# Patient Record
Sex: Male | Born: 1995 | Race: White | Hispanic: No | Marital: Single | Smoking: Never smoker
Health system: Southern US, Community
[De-identification: ages and names within clinical notes are randomized; demographics above are authoritative.]

## PROBLEM LIST (undated history)

## (undated) DIAGNOSIS — K219 Gastro-esophageal reflux disease without esophagitis: Secondary | ICD-10-CM

## (undated) HISTORY — PX: WISDOM TOOTH EXTRACTION: SHX21

---
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
## 2015-05-31 ENCOUNTER — Encounter (INDEPENDENT_AMBULATORY_CARE_PROVIDER_SITE_OTHER): Payer: Self-pay

## 2015-05-31 ENCOUNTER — Encounter: Payer: Self-pay | Admitting: Family Medicine

## 2015-05-31 ENCOUNTER — Ambulatory Visit (INDEPENDENT_AMBULATORY_CARE_PROVIDER_SITE_OTHER): Payer: Medicaid Other | Admitting: Family Medicine

## 2015-05-31 VITALS — BP 117/72 | HR 90 | Temp 98.2°F | Ht 68.19 in | Wt 116.6 lb

## 2015-05-31 DIAGNOSIS — W57XXXA Bitten or stung by nonvenomous insect and other nonvenomous arthropods, initial encounter: Secondary | ICD-10-CM | POA: Diagnosis not present

## 2015-05-31 DIAGNOSIS — J029 Acute pharyngitis, unspecified: Secondary | ICD-10-CM

## 2015-05-31 LAB — VERITOR FLU A/B WAIVED
INFLUENZA A: NEGATIVE
Influenza B: NEGATIVE

## 2015-05-31 LAB — RAPID STREP SCREEN (MED CTR MEBANE ONLY): Strep Gp A Ag, IA W/Reflex: POSITIVE — AB

## 2015-05-31 MED ORDER — DOXYCYCLINE HYCLATE 100 MG PO TABS: 100.0000 mg | ORAL_TABLET | Freq: Two times a day (BID) | ORAL | Status: DC

## 2015-05-31 NOTE — Addendum Note (Signed)
Addended by: Arville CareETTINGER, JOSHUA on: 05/31/2015 01:01 PM   Modules accepted: Orders, SmartSet

## 2015-05-31 NOTE — Progress Notes (Signed)
BP 117/72 mmHg  Pulse 90  Temp(Src) 98.2 F (36.8 C) (Oral)  Ht 5' 8.19" (1.732 m)  Wt 116 lb 9.6 oz (52.889 kg)  BMI 17.63 kg/m2   Subjective:    Patient ID: Brandon Glenn, male    DOB: 09-Apr-1995, 20 y.o.   MRN: [id number]  HPI: Brandon Glenn is a 20 y.o. male presenting on 05/31/2015 for Sore Throat and Vomiting   HPI Sore throat Patient has been having sore throat and congestion and a couple episodes of vomiting and abdominal discomfort. This all started yesterday. He denies any known fevers but he has had chills and aches. He was also clammy last night overnight. He denies any shortness of breath or wheezing. His sore throat is made it so easily not drinking or eating much because of the soreness.  Tick bite. Patient noticed that he had a tick bite and pulled the tick out of his left hamstring 2 days ago. He does not want how long the tick was in him but it did not look fully engorged at the time. He denies any fevers or chills or circular rashes anywhere else on his body. There is a small amount of bruising around with a tick bite was.  Relevant past medical, surgical, family and social history reviewed and updated as indicated. Interim medical history since our last visit reviewed. Allergies and medications reviewed and updated.  Review of Systems  Constitutional: Positive for chills. Negative for fever.  HENT: Positive for congestion, postnasal drip, rhinorrhea, sinus pressure, sneezing and sore throat. Negative for ear discharge, ear pain and voice change.   Eyes: Negative for pain, discharge, redness and visual disturbance.  Respiratory: Positive for cough. Negative for chest tightness, shortness of breath and wheezing.   Cardiovascular: Negative for chest pain and leg swelling.  Gastrointestinal: Negative for abdominal pain, diarrhea and constipation.  Genitourinary: Negative for difficulty urinating.  Musculoskeletal: Positive for myalgias. Negative for back  pain and gait problem.  Skin: Positive for wound. Negative for rash.  Neurological: Negative for syncope, light-headedness and headaches.  All other systems reviewed and are negative.   Per HPI unless specifically indicated above     Medication List       This list is accurate as of: 05/31/15 12:56 PM.  Always use your most recent med list.               doxycycline 100 MG tablet  Commonly known as:  VIBRA-TABS  Take 1 tablet (100 mg total) by mouth 2 (two) times daily. 1 po bid           Objective:    BP 117/72 mmHg  Pulse 90  Temp(Src) 98.2 F (36.8 C) (Oral)  Ht 5' 8.19" (1.732 m)  Wt 116 lb 9.6 oz (52.889 kg)  BMI 17.63 kg/m2  Wt Readings from Last 3 Encounters:  05/31/15 116 lb 9.6 oz (52.889 kg) (2 %*, Z = -2.02)  08/15/14 118 lb (53.524 kg) (4 %*, Z = -1.79)  01/25/14 116 lb 6.4 oz (52.799 kg) (4 %*, Z = -1.77)   * Growth percentiles are based on CDC 2-20 Years data.    Physical Exam  Constitutional: He is oriented to person, place, and time. He appears well-developed and well-nourished. No distress.  HENT:  Right Ear: Tympanic membrane, external ear and ear canal normal.  Left Ear: Tympanic membrane, external ear and ear canal normal.  Nose: Mucosal edema and rhinorrhea present. No sinus tenderness. No epistaxis.  Right sinus exhibits maxillary sinus tenderness. Right sinus exhibits no frontal sinus tenderness. Left sinus exhibits maxillary sinus tenderness. Left sinus exhibits no frontal sinus tenderness.  Mouth/Throat: Uvula is midline and mucous membranes are normal. Posterior oropharyngeal edema and posterior oropharyngeal erythema present. No oropharyngeal exudate or tonsillar abscesses.  Eyes: Conjunctivae and EOM are normal. Pupils are equal, round, and reactive to light. Right eye exhibits no discharge. No scleral icterus.  Neck: Neck supple. No thyromegaly present.  Cardiovascular: Normal rate, regular rhythm, normal heart sounds and intact distal  pulses.   No murmur heard. Pulmonary/Chest: Effort normal and breath sounds normal. No respiratory distress. He has no wheezes. He has no rales.  Musculoskeletal: Normal range of motion. He exhibits no edema.  Lymphadenopathy:    He has no cervical adenopathy.  Neurological: He is alert and oriented to person, place, and time. Coordination normal.  Skin: Skin is warm and dry. Lesion (Small area of bruising where the bite from the tick was.) noted. No rash noted. He is not diaphoretic.  Psychiatric: He has a normal mood and affect. His behavior is normal.  Nursing note and vitals reviewed.   Flu negative, strep positive    Assessment & Plan:   Problem List Items Addressed This Visit    None    Visit Diagnoses    Tick bite    -  Primary    Left hamstring tick bite, does not know how long it was in his skin. We'll give doxycycline    Relevant Medications    doxycycline (VIBRA-TABS) 100 MG tablet    Acute pharyngitis, unspecified etiology        Relevant Medications    doxycycline (VIBRA-TABS) 100 MG tablet    Other Relevant Orders    Rapid strep screen (not at [facility])    Veritor Flu A/B Waived        Follow up plan: Return if symptoms worsen or fail to improve.  Counseling provided for all of the vaccine components Orders Placed This Encounter  Procedures  . Rapid strep screen (not at [facility])  . Veritor Flu A/B Waived    Arville Care, MD [facility] 05/31/2015, 12:56 PM

## 2017-10-03 ENCOUNTER — Ambulatory Visit: Payer: Self-pay | Admitting: Family

## 2021-03-03 ENCOUNTER — Emergency Department (HOSPITAL_COMMUNITY)
Admission: EM | Admit: 2021-03-03 | Discharge: 2021-03-03 | Disposition: A | Discharge status: Home / Self Care | Payer: Medicaid Other | Attending: Emergency Medicine | Admitting: Emergency Medicine

## 2021-03-03 ENCOUNTER — Other Ambulatory Visit: Payer: Self-pay

## 2021-03-03 ENCOUNTER — Emergency Department (HOSPITAL_COMMUNITY): Payer: Medicaid Other

## 2021-03-03 ENCOUNTER — Encounter (HOSPITAL_COMMUNITY): Payer: Self-pay

## 2021-03-03 DIAGNOSIS — Y900 Blood alcohol level of less than 20 mg/100 ml: Secondary | ICD-10-CM | POA: Insufficient documentation

## 2021-03-03 DIAGNOSIS — R0602 Shortness of breath: Secondary | ICD-10-CM

## 2021-03-03 DIAGNOSIS — Z79899 Other long term (current) drug therapy: Secondary | ICD-10-CM | POA: Insufficient documentation

## 2021-03-03 LAB — CBC WITH DIFFERENTIAL/PLATELET
Abs Immature Granulocytes: 0.04 10*3/uL (ref 0.00–0.07)
Basophils Absolute: 0.1 10*3/uL (ref 0.0–0.1)
Basophils Relative: 0 %
Eosinophils Absolute: 0 10*3/uL (ref 0.0–0.5)
Eosinophils Relative: 0 %
HCT: 47.3 % (ref 39.0–52.0)
Hemoglobin: 16.5 g/dL (ref 13.0–17.0)
Immature Granulocytes: 0 %
Lymphocytes Relative: 13 %
Lymphs Abs: 1.4 10*3/uL (ref 0.7–4.0)
MCH: 34.2 pg — ABNORMAL HIGH (ref 26.0–34.0)
MCHC: 34.9 g/dL (ref 30.0–36.0)
MCV: 97.9 fL (ref 80.0–100.0)
Monocytes Absolute: 0.5 10*3/uL (ref 0.1–1.0)
Monocytes Relative: 5 %
Neutro Abs: 9.1 10*3/uL — ABNORMAL HIGH (ref 1.7–7.7)
Neutrophils Relative %: 82 %
Platelets: 288 10*3/uL (ref 150–400)
RBC: 4.83 MIL/uL (ref 4.22–5.81)
RDW: 12.3 % (ref 11.5–15.5)
WBC: 11.1 10*3/uL — ABNORMAL HIGH (ref 4.0–10.5)
nRBC: 0 % (ref 0.0–0.2)

## 2021-03-03 LAB — COMPREHENSIVE METABOLIC PANEL
ALT: 28 U/L (ref 0–44)
AST: 28 U/L (ref 15–41)
Albumin: 5.3 g/dL — ABNORMAL HIGH (ref 3.5–5.0)
Alkaline Phosphatase: 65 U/L (ref 38–126)
Anion gap: 12 (ref 5–15)
BUN: <5 mg/dL — ABNORMAL LOW (ref 6–20)
CO2: 26 mmol/L (ref 22–32)
Calcium: 10.7 mg/dL — ABNORMAL HIGH (ref 8.9–10.3)
Chloride: 103 mmol/L (ref 98–111)
Creatinine, Ser: 0.82 mg/dL (ref 0.61–1.24)
GFR, Estimated: >60 mL/min (ref >60)
Glucose, Bld: 106 mg/dL — ABNORMAL HIGH (ref 70–99)
Potassium: 3.9 mmol/L (ref 3.5–5.1)
Sodium: 141 mmol/L (ref 135–145)
Total Bilirubin: 1.1 mg/dL (ref 0.3–1.2)
Total Protein: 8.3 g/dL — ABNORMAL HIGH (ref 6.5–8.1)

## 2021-03-03 LAB — ETHANOL: Alcohol, Ethyl (B): <10 mg/dL (ref <10)

## 2021-03-03 LAB — SEDIMENTATION RATE: Sed Rate: 2 mm/hr (ref 0–16)

## 2021-03-03 MED ORDER — AZITHROMYCIN 250 MG PO TABS
500.0000 mg | ORAL_TABLET | Freq: Once | ORAL | Status: AC
  Administered 2021-03-03: 500 mg via ORAL
  Filled 2021-03-03: qty 2

## 2021-03-03 MED ORDER — AZITHROMYCIN 250 MG PO TABS
250.0000 mg | ORAL_TABLET | Freq: Every day | ORAL | 0 refills | Status: AC
Start: 2021-03-03 — End: 2021-03-07

## 2021-03-03 MED ORDER — PREDNISONE 20 MG PO TABS
60.0000 mg | ORAL_TABLET | ORAL | Status: AC
  Administered 2021-03-03: 60 mg via ORAL
  Filled 2021-03-03: qty 3

## 2021-03-03 MED ORDER — PREDNISONE 20 MG PO TABS: 40.0000 mg | ORAL_TABLET | Freq: Every day | ORAL | 0 refills | Status: DC

## 2021-03-03 NOTE — ED Triage Notes (Signed)
Patient thinks he has had mold exposure from home. Patient complains of sore throat and states top of mouth swollen, also complains of upper chest muscle pain with any ROM

## 2021-03-03 NOTE — ED Notes (Signed)
Patient transported to X-ray 

## 2021-03-03 NOTE — Discharge Instructions (Signed)
As discussed, your evaluation today has been largely reassuring.  But, it is important that you monitor your condition carefully, and do not hesitate to return to the ED if you develop new, or concerning changes in your condition.  Symptoms may be due to bronchitis.  Your medications should help ease her symptoms over the coming days.  Please follow-up with our physicians for appropriate ongoing care.

## 2021-03-03 NOTE — ED Provider Triage Note (Signed)
Emergency Medicine Provider Triage Evaluation Note  Brandon Glenn , a 26 y.o. male  was evaluated in triage.  Pt complains of mold exposure. He states that he has mold everywhere in his room and he has had something 'congestion around my heart' for the past few days.' No PMH.  Review of Systems  Positive:  Negative:   Physical Exam  BP (!) 153/89 (BP Location: Right Arm)    Pulse (!) 59    Temp 98.1 F (36.7 C) (Oral)    Resp 18    SpO2 100%  Gen:   Awake, no distress   Resp:  Normal effort  MSK:   Moves extremities without difficulty  Other:    Medical Decision Making  Medically screening exam initiated at 9:53 AM.  Appropriate orders placed.  SANTINO KINSELLA was informed that the remainder of the evaluation will be completed by another provider, this initial triage assessment does not replace that evaluation, and the importance of remaining in the ED until their evaluation is complete.     Silva Bandy, PA-C 03/03/21 [phone]

## 2021-03-03 NOTE — ED Provider Notes (Signed)
[facility] EMERGENCY DEPARTMENT Provider Note   CSN: [id number] Arrival date & time: 03/03/21  6294     History  No chief complaint on file.   Brandon Glenn is a 26 y.o. male.  HPI Adult male presents with his grandmother who assists with history.  He is generally well, though he notes he smoke cigarettes occasionally, is attempting to switch to vaping.  He also uses delta 8 edible products.  He otherwise denies use of illicit substances or alcohol.  2 weeks ago the patient began having a sore throat but he was seen, evaluated urgent care, reportedly was diagnosed with strep, completed antibiotics.  Since that time he has had persistent fullness sensation bilaterally in his throat, and reported difficulty with swallowing and breathing.  He notes that yesterday he also developed palpitations intermittently, with occasional unsettled sensation in his left upper chest.  No fever, he did vomit yesterday.  After the initial interpretation the patient states that he is concerned about mold exposure in his house.    Home Medications Prior to Admission medications   Medication Sig Start Date End Date Taking? Authorizing Provider  doxycycline (VIBRA-TABS) 100 MG tablet Take 1 tablet (100 mg total) by mouth 2 (two) times daily. 1 po bid 05/31/15   Dettinger, Elige Radon, MD      Allergies    Patient has no known allergies.    Review of Systems   Review of Systems  Constitutional:        Per HPI, otherwise negative  HENT:         Per HPI, otherwise negative  Respiratory:         Per HPI, otherwise negative  Cardiovascular:        Per HPI, otherwise negative  Gastrointestinal:  Positive for vomiting.  Endocrine:       Negative aside from HPI  Genitourinary:        Neg aside from HPI   Musculoskeletal:        Per HPI, otherwise negative  Skin: Negative.   Neurological:  Negative for syncope.   Physical Exam Updated Vital Signs BP (!) 153/89 (BP Location: Right  Arm)    Pulse (!) 59    Temp 98.1 F (36.7 C) (Oral)    Resp 18    SpO2 100%  Physical Exam Vitals and nursing note reviewed.  Constitutional:      General: He is not in acute distress.    Appearance: He is well-developed.  HENT:     Head: Normocephalic and atraumatic.     Mouth/Throat:   Eyes:     Conjunctiva/sclera: Conjunctivae normal.  Neck:     Thyroid: No thyroid mass, thyromegaly or thyroid tenderness.     Trachea: No tracheal tenderness.  Cardiovascular:     Rate and Rhythm: Normal rate and regular rhythm.  Pulmonary:     Effort: Pulmonary effort is normal. No respiratory distress.     Breath sounds: No stridor.  Abdominal:     General: There is no distension.  Musculoskeletal:     Cervical back: Normal range of motion and neck supple.  Lymphadenopathy:     Cervical:     Right cervical: No superficial or posterior cervical adenopathy.    Left cervical: No superficial or posterior cervical adenopathy.  Skin:    General: Skin is warm and dry.       Neurological:     Mental Status: He is alert and oriented to person, place,  and time.    ED Results / Procedures / Treatments   Labs (all labs ordered are listed, but only abnormal results are displayed) Labs Reviewed  COMPREHENSIVE METABOLIC PANEL  ETHANOL  CBC WITH DIFFERENTIAL/PLATELET  SEDIMENTATION RATE    EKG EKG Interpretation  Date/Time:  Saturday March 03 2021 09:43:02 EST Ventricular Rate:  58 PR Interval:  104 QRS Duration: 104 QT Interval:  412 QTC Calculation: 404 R Axis:   90 Text Interpretation: Sinus bradycardia with short PR Rightward axis and possible BER pattern, may be physiologically normal for age No previous ECGs available Confirmed by Alvester Chou [phone]) on 03/03/2021 10:36:19 AM  Radiology DG Chest 2 View  Result Date: 03/03/2021 CLINICAL DATA:  Short of breath for several days. EXAM: CHEST - 2 VIEW COMPARISON:  02/12/2013. FINDINGS: Normal heart, mediastinum and hila. Clear  lungs.  No pleural effusion or pneumothorax. Skeletal structures are unremarkable. IMPRESSION: No active cardiopulmonary disease. Electronically Signed   By: Amie Portland M.D.   On: 03/03/2021 10:12    Procedures Procedures    Medications Ordered in ED Medications - No data to display  ED Course/ Medical Decision Making/ A&P  2:33 PM Patient awake alert, sitting upright, hemodynamically unremarkable.  Cardiac 50s sinus normal Pulse ox 99% room air normal I discussed all findings with him and his grandmother.                         Medical Decision Making Differential including pharyngitis, bacteremia, pneumonia, post strep inflammation all considered.  It is reassuring, EKG nonischemic, no evidence for cardiomyopathy on x-ray, EKG, and with normal sed rate low suspicion for post strep application.  Reviewed and interpreted the x-ray and EKG myself. No evidence for pneumonia, no evidence respiratory compromise, no evidence for bacteremia, sepsis.  Does have mild leukocytosis on the labs consistent with possible bronchitis versus sequelae of recent strep throat infection.  Patient started on antibiotics, steroids with considerations given his discomfort.  No evidence for Lemierre's disease or other deep space infection.        Final Clinical Impression(s) / ED Diagnoses Final diagnoses:  Shortness of breath    Rx / DC Orders ED Discharge Orders     None         Gerhard Munch, MD 03/03/21 1436

## 2022-05-01 ENCOUNTER — Emergency Department (HOSPITAL_COMMUNITY)
Admission: EM | Admit: 2022-05-01 | Discharge: 2022-05-01 | Disposition: A | Discharge status: Home / Self Care | Payer: Medicaid Other | Attending: Emergency Medicine | Admitting: Emergency Medicine

## 2022-05-01 ENCOUNTER — Encounter (HOSPITAL_COMMUNITY): Payer: Self-pay | Admitting: *Deleted

## 2022-05-01 ENCOUNTER — Other Ambulatory Visit: Payer: Self-pay

## 2022-05-01 ENCOUNTER — Emergency Department (HOSPITAL_COMMUNITY): Payer: Medicaid Other

## 2022-05-01 DIAGNOSIS — D72829 Elevated white blood cell count, unspecified: Secondary | ICD-10-CM | POA: Diagnosis not present

## 2022-05-01 DIAGNOSIS — Z1152 Encounter for screening for COVID-19: Secondary | ICD-10-CM | POA: Diagnosis not present

## 2022-05-01 DIAGNOSIS — R079 Chest pain, unspecified: Secondary | ICD-10-CM | POA: Diagnosis not present

## 2022-05-01 DIAGNOSIS — K2950 Unspecified chronic gastritis without bleeding: Secondary | ICD-10-CM | POA: Insufficient documentation

## 2022-05-01 DIAGNOSIS — E876 Hypokalemia: Secondary | ICD-10-CM | POA: Diagnosis not present

## 2022-05-01 DIAGNOSIS — R1012 Left upper quadrant pain: Secondary | ICD-10-CM

## 2022-05-01 DIAGNOSIS — R109 Unspecified abdominal pain: Secondary | ICD-10-CM | POA: Diagnosis not present

## 2022-05-01 DIAGNOSIS — R9431 Abnormal electrocardiogram [ECG] [EKG]: Secondary | ICD-10-CM | POA: Diagnosis not present

## 2022-05-01 DIAGNOSIS — R1032 Left lower quadrant pain: Secondary | ICD-10-CM | POA: Diagnosis not present

## 2022-05-01 LAB — COMPREHENSIVE METABOLIC PANEL
ALT: 16 U/L (ref 0–44)
AST: 25 U/L (ref 15–41)
Albumin: 4.8 g/dL (ref 3.5–5.0)
Alkaline Phosphatase: 61 U/L (ref 38–126)
Anion gap: 10 (ref 5–15)
BUN: 16 mg/dL (ref 6–20)
CO2: 25 mmol/L (ref 22–32)
Calcium: 9 mg/dL (ref 8.9–10.3)
Chloride: 100 mmol/L (ref 98–111)
Creatinine, Ser: 0.94 mg/dL (ref 0.61–1.24)
GFR, Estimated: >60 mL/min (ref >60)
Glucose, Bld: 135 mg/dL — ABNORMAL HIGH (ref 70–99)
Potassium: 2.9 mmol/L — ABNORMAL LOW (ref 3.5–5.1)
Sodium: 135 mmol/L (ref 135–145)
Total Bilirubin: 0.8 mg/dL (ref 0.3–1.2)
Total Protein: 7.7 g/dL (ref 6.5–8.1)

## 2022-05-01 LAB — RESP PANEL BY RT-PCR (RSV, FLU A&B, COVID)  RVPGX2
Influenza A by PCR: NEGATIVE
Influenza B by PCR: NEGATIVE
Resp Syncytial Virus by PCR: NEGATIVE
SARS Coronavirus 2 by RT PCR: NEGATIVE

## 2022-05-01 LAB — URINALYSIS, ROUTINE W REFLEX MICROSCOPIC
Bilirubin Urine: NEGATIVE
Glucose, UA: NEGATIVE mg/dL
Hgb urine dipstick: NEGATIVE
Ketones, ur: NEGATIVE mg/dL
Leukocytes,Ua: NEGATIVE
Nitrite: NEGATIVE
Protein, ur: NEGATIVE mg/dL
Specific Gravity, Urine: <1.005 — ABNORMAL LOW (ref 1.005–1.030)
pH: 6 (ref 5.0–8.0)

## 2022-05-01 LAB — CBC WITH DIFFERENTIAL/PLATELET
Abs Immature Granulocytes: 0.05 10*3/uL (ref 0.00–0.07)
Basophils Absolute: 0.1 10*3/uL (ref 0.0–0.1)
Basophils Relative: 1 %
Eosinophils Absolute: 0 10*3/uL (ref 0.0–0.5)
Eosinophils Relative: 0 %
HCT: 44.9 % (ref 39.0–52.0)
Hemoglobin: 15.6 g/dL (ref 13.0–17.0)
Immature Granulocytes: 0 %
Lymphocytes Relative: 25 %
Lymphs Abs: 3.3 10*3/uL (ref 0.7–4.0)
MCH: 34.6 pg — ABNORMAL HIGH (ref 26.0–34.0)
MCHC: 34.7 g/dL (ref 30.0–36.0)
MCV: 99.6 fL (ref 80.0–100.0)
Monocytes Absolute: 0.9 10*3/uL (ref 0.1–1.0)
Monocytes Relative: 7 %
Neutro Abs: 9.1 10*3/uL — ABNORMAL HIGH (ref 1.7–7.7)
Neutrophils Relative %: 67 %
Platelets: 263 10*3/uL (ref 150–400)
RBC: 4.51 MIL/uL (ref 4.22–5.81)
RDW: 12.5 % (ref 11.5–15.5)
WBC: 13.4 10*3/uL — ABNORMAL HIGH (ref 4.0–10.5)
nRBC: 0 % (ref 0.0–0.2)

## 2022-05-01 LAB — LIPASE, BLOOD: Lipase: 42 U/L (ref 11–51)

## 2022-05-01 MED ORDER — SUCRALFATE 1 G PO TABS: 1.0000 g | ORAL_TABLET | Freq: Three times a day (TID) | ORAL | 0 refills | Status: DC

## 2022-05-01 MED ORDER — OMEPRAZOLE 20 MG PO CPDR: 20.0000 mg | DELAYED_RELEASE_CAPSULE | Freq: Every day | ORAL | 0 refills | Status: DC

## 2022-05-01 MED ORDER — LIDOCAINE VISCOUS HCL 2 % MT SOLN
15.0000 mL | Freq: Once | OROMUCOSAL | Status: AC
  Administered 2022-05-01: 15 mL via ORAL
  Filled 2022-05-01: qty 15

## 2022-05-01 MED ORDER — POTASSIUM CHLORIDE CRYS ER 20 MEQ PO TBCR
40.0000 meq | EXTENDED_RELEASE_TABLET | Freq: Once | ORAL | Status: AC
Start: 2022-05-01 — End: 2022-05-01
  Administered 2022-05-01: 40 meq via ORAL
  Filled 2022-05-01: qty 2

## 2022-05-01 MED ORDER — ALUM & MAG HYDROXIDE-SIMETH 200-200-20 MG/5ML PO SUSP
30.0000 mL | Freq: Once | ORAL | Status: AC
  Administered 2022-05-01: 30 mL via ORAL
  Filled 2022-05-01: qty 30

## 2022-05-01 NOTE — ED Triage Notes (Signed)
Pt with LLQ pain that radiates up to mid chest for past 7 months per pt. Pt denies any N/V/D,

## 2022-05-01 NOTE — ED Provider Notes (Signed)
 [place] EMERGENCY DEPARTMENT AT [facility] Provider Note   CSN: [id number] Arrival date & time: 05/01/22  1157     History Chief Complaint  Patient presents with   Abdominal Pain    Brandon Glenn is a 27 y.o. male.  Patient presents to the emergency department complaints of abdominal pain.  He reports that his pain is been present for some around 6 to 7 months.  He reports that he believes that he ate a*popsicle that causes abdominal pain and emergently began months ago.  Now reports that pain is intermittent and located under he is left-sided ribs.  Patient denies any associated symptoms including nausea, vomiting, diarrhea, fevers.   Abdominal Pain      Home Medications Prior to Admission medications   Medication Sig Start Date End Date Taking? Authorizing Provider  acetaminophen (TYLENOL) 500 MG tablet Take 500 mg by mouth every 6 (six) hours as needed for mild pain.   Yes [provider]  ibuprofen (ADVIL) 200 MG tablet Take 400 mg by mouth every 6 (six) hours as needed for moderate pain.   Yes [provider]  omeprazole (PRILOSEC) 20 MG capsule Take 1 capsule (20 mg total) by mouth daily. 05/01/22 05/31/22 Yes Smitty Knudsen, PA-C  sucralfate (CARAFATE) 1 g tablet Take 1 tablet (1 g total) by mouth 4 (four) times daily -  with meals and at bedtime for 15 days. 05/01/22 05/16/22 Yes Smitty Knudsen, PA-C  doxycycline (VIBRA-TABS) 100 MG tablet Take 1 tablet (100 mg total) by mouth 2 (two) times daily. 1 po bid Patient not taking: Reported on 05/01/2022 05/31/15   Dettinger, Elige Radon, MD  predniSONE (DELTASONE) 20 MG tablet Take 2 tablets (40 mg total) by mouth daily with breakfast. For the next four days Patient not taking: Reported on 05/01/2022 03/03/21   Gerhard Munch, MD      Allergies    Patient has no known allergies.    Review of Systems   Review of Systems  Gastrointestinal:  Positive for abdominal pain.  All other systems reviewed and  are negative.   Physical Exam Updated Vital Signs BP (!) 110/98   Pulse 87   Temp 98.7 F (37.1 C) (Temporal)   Resp (!) 21   Ht 5\' 9"  (1.753 m)   Wt 51.5 kg   SpO2 95%   BMI 16.76 kg/m  Physical Exam Vitals and nursing note reviewed.  Constitutional:      Appearance: He is well-developed.  HENT:     Head: Normocephalic and atraumatic.  Cardiovascular:     Rate and Rhythm: Normal rate and regular rhythm.  Pulmonary:     Effort: Pulmonary effort is normal. No respiratory distress.     Breath sounds: Normal breath sounds.  Abdominal:     General: Abdomen is flat. There is no distension.     Palpations: Abdomen is soft.     Tenderness: There is no abdominal tenderness. There is no guarding.  Skin:    General: Skin is warm and dry.     Capillary Refill: Capillary refill takes less than 2 seconds.     Findings: No erythema.  Neurological:     General: No focal deficit present.     Mental Status: He is alert.     ED Results / Procedures / Treatments   Labs (all labs ordered are listed, but only abnormal results are displayed) Labs Reviewed  COMPREHENSIVE METABOLIC PANEL - Abnormal; Notable for the following components:  Result Value   Potassium 2.9 (*)    Glucose, Bld 135 (*)    All other components within normal limits  CBC WITH DIFFERENTIAL/PLATELET - Abnormal; Notable for the following components:   WBC 13.4 (*)    MCH 34.6 (*)    Neutro Abs 9.1 (*)    All other components within normal limits  URINALYSIS, ROUTINE W REFLEX MICROSCOPIC - Abnormal; Notable for the following components:   Specific Gravity, Urine <1.005 (*)    All other components within normal limits  RESP PANEL BY RT-PCR (RSV, FLU A&B, COVID)  RVPGX2  LIPASE, BLOOD    EKG None  Radiology CT ABDOMEN PELVIS WO CONTRAST  Result Date: 05/01/2022 CLINICAL DATA:  Abdominal pain, acute, nonlocalized EXAM: CT ABDOMEN AND PELVIS WITHOUT CONTRAST TECHNIQUE: Multidetector CT imaging of the  abdomen and pelvis was performed following the standard protocol without IV contrast. RADIATION DOSE REDUCTION: This exam was performed according to the departmental dose-optimization program which includes automated exposure control, adjustment of the mA and/or kV according to patient size and/or use of iterative reconstruction technique. COMPARISON:  CT abdomen/pelvis January 14, 2006. FINDINGS: Lower chest: No acute abnormality. Hepatobiliary: No focal liver abnormality is seen. No gallstones, gallbladder wall thickening, or biliary dilatation. Pancreas: Unremarkable. No pancreatic ductal dilatation or surrounding inflammatory changes. Spleen: Normal in size without focal abnormality. Adrenals/Urinary Tract: Adrenal glands are unremarkable. Kidneys are normal, without renal calculi, focal lesion, or hydronephrosis. Bladder is unremarkable. Stomach/Bowel: Stomach is within normal limits. Appendix appears normal. No evidence of bowel wall thickening, distention, or inflammatory changes. Vascular/Lymphatic: No significant vascular findings are present. No enlarged abdominal or pelvic lymph nodes. Reproductive: Prostate is unremarkable. Other: No abdominal wall hernia or abnormality. No abdominopelvic ascites. Musculoskeletal: No fracture is seen. IMPRESSION: No evidence of acute abnormality. Electronically Signed   By: Feliberto Harts M.D.   On: 05/01/2022 13:17   DG Chest Portable 1 View  Result Date: 05/01/2022 CLINICAL DATA:  Left lower quadrant pain radiating up to mid chest for past 7 months. EXAM: PORTABLE CHEST 1 VIEW COMPARISON:  Chest two views 03/03/2021 FINDINGS: The lateral most aspect of the inferolateral left pleura and ribs is incompletely imaged. Cardiac silhouette and mediastinal contours are within normal limits. The lungs are clear. No pleural effusion or pneumothorax. No acute skeletal abnormality. IMPRESSION: No active disease. Electronically Signed   By: Neita Garnet M.D.   On:  05/01/2022 13:02    Procedures Procedures   Medications Ordered in ED Medications  potassium chloride SA (KLOR-CON M) CR tablet 40 mEq (40 mEq Oral Given 05/01/22 1436)  alum & mag hydroxide-simeth (MAALOX/MYLANTA) 200-200-20 MG/5ML suspension 30 mL (30 mLs Oral Given 05/01/22 1510)    And  lidocaine (XYLOCAINE) 2 % viscous mouth solution 15 mL (15 mLs Oral Given 05/01/22 1510)    ED Course/ Medical Decision Making/ A&P                           Medical Decision Making Amount and/or Complexity of Data Reviewed Labs: ordered. Radiology: ordered.  Risk OTC drugs. Prescription drug management.   This patient presents to the ED for concern of abdominal pain. Differential diagnosis includes pancreatitis, gastritis, bowel obstruction, cholecystitis,    Lab Tests:  I Ordered, and personally interpreted labs.  The pertinent results include:  Leukocytosis at 13.4, hypokalemia at 2.9, negative UA, negative lipase, negative RVP   Imaging Studies ordered:  I ordered imaging studies including chest  xray, CT abdomen  I independently visualized and interpreted imaging which showed no acute cardiopulmonary disease and no acute abdominal abnormalities I agree with the radiologist interpretation   Medicines ordered and prescription drug management:  I ordered medication including Potasisum, Maalox, lidocaine  for hypokalemia, abdominal pain  Reevaluation of the patient after these medicines showed that the patient improved I have reviewed the patients home medicines and have made adjustments as needed   Problem List / ED Course:  Patient presented to the ED for abdominal pain. He reports feeling pain in LLQ with radiation towards mid chest for several months. Denies any nausea, vomiting, diarrhea, acute changes in bowel movements. Reports some previous alcohol use and current marijuana use, but no other substances. Imaging was ordered given unclear picture which did not show any acute  abnormalities so I believe patient would benefit from outpatient GI follow up for further evaluation. Also ordered prescriptions for PPI and sucralfate for symptomatic improvement. Patient agreeable with treatment plan as he is concerned for this pain he is experiencing without a specific cause. All questions answered prior to patient discharge.  Final Clinical Impression(s) / ED Diagnoses Final diagnoses:  Left upper quadrant abdominal pain  Chronic gastritis without bleeding, unspecified gastritis type    Rx / DC Orders ED Discharge Orders          Ordered    omeprazole (PRILOSEC) 20 MG capsule  Daily        05/01/22 1540    sucralfate (CARAFATE) 1 g tablet  3 times daily with meals & bedtime        05/01/22 1540              Salomon Mast 05/01/22 Tawnya Crook, MD 05/02/22 [phone]

## 2022-05-01 NOTE — Discharge Instructions (Addendum)
You were seen in the emergency department for abdominal pain. Thankfully all of your labs and imaging were reassuring at this time. Your CT scan did show your stomach appears to be somewhat enlarged and distended which may be causing your symptoms. I have attached information for [facility] which you should reach out to schedule an appointment for further evaluation. I have also sent two medications to your pharmacy to hopefully provide some symptomatic relief until you can follow up with the gastroenterologist.

## 2022-05-02 ENCOUNTER — Encounter: Payer: Self-pay | Admitting: Nurse Practitioner

## 2022-05-06 DIAGNOSIS — K2900 Acute gastritis without bleeding: Secondary | ICD-10-CM | POA: Diagnosis not present

## 2022-05-06 DIAGNOSIS — M542 Cervicalgia: Secondary | ICD-10-CM | POA: Diagnosis not present

## 2022-05-06 DIAGNOSIS — R109 Unspecified abdominal pain: Secondary | ICD-10-CM | POA: Diagnosis not present

## 2022-06-11 ENCOUNTER — Encounter: Payer: Self-pay | Admitting: Nurse Practitioner

## 2022-06-11 ENCOUNTER — Other Ambulatory Visit (INDEPENDENT_AMBULATORY_CARE_PROVIDER_SITE_OTHER): Payer: Medicaid Other

## 2022-06-11 ENCOUNTER — Ambulatory Visit (INDEPENDENT_AMBULATORY_CARE_PROVIDER_SITE_OTHER): Payer: Medicaid Other | Admitting: Nurse Practitioner

## 2022-06-11 VITALS — BP 122/70 | HR 88 | Ht 69.0 in | Wt 116.0 lb

## 2022-06-11 DIAGNOSIS — E876 Hypokalemia: Secondary | ICD-10-CM

## 2022-06-11 DIAGNOSIS — R1012 Left upper quadrant pain: Secondary | ICD-10-CM | POA: Insufficient documentation

## 2022-06-11 LAB — BASIC METABOLIC PANEL
BUN: 12 mg/dL (ref 6–23)
CO2: 28 mEq/L (ref 19–32)
Calcium: 9.5 mg/dL (ref 8.4–10.5)
Chloride: 103 mEq/L (ref 96–112)
Creatinine, Ser: 0.89 mg/dL (ref 0.40–1.50)
GFR: 118.16 mL/min (ref >60.00)
Glucose, Bld: 92 mg/dL (ref 70–99)
Potassium: 4 mEq/L (ref 3.5–5.1)
Sodium: 138 mEq/L (ref 135–145)

## 2022-06-11 MED ORDER — OMEPRAZOLE 20 MG PO CPDR: 20.0000 mg | DELAYED_RELEASE_CAPSULE | Freq: Every day | ORAL | 1 refills | Status: DC

## 2022-06-11 NOTE — Patient Instructions (Signed)
You have been scheduled for an endoscopy. Please follow written instructions given to you at your visit today. If you use inhalers (even only as needed), please bring them with you on the day of your procedure.  Due to recent changes in healthcare laws, you may see the results of your imaging and laboratory studies on MyChart before your provider has had a chance to review them.  We understand that in some cases there may be results that are confusing or concerning to you. Not all laboratory results come back in the same time frame and the provider may be waiting for multiple results in order to interpret others.  Please give us 48 hours in order for your provider to thoroughly review all the results before contacting the office for clarification of your results.    Thank you for trusting me with your gastrointestinal care!   Colleen Kennedy-Smith, CRNP   

## 2022-06-11 NOTE — Progress Notes (Signed)
Agree with assessment and plan as outlined.  

## 2022-06-11 NOTE — Progress Notes (Signed)
06/11/2022 ALGERNON MUNDIE [id number] February 25, 1996   CHIEF COMPLAINT: LUQ pain  HISTORY OF PRESENT ILLNESS: Clearance Chenault is a 26 year old male with a past medical history of bronchitis and GERD. Past surgical history includes wisdom teeth extraction.  He presents today for further evaluation regarding LUQ pain, he is concerned he has a stomach ulcer.  He is presented with his grandmother.  He developed LUQ pain which progressively worsened for week or 2 therefore he presented to [facility] 05/01/2022.  Earlier that day, he ate a handful of sour candy which he thinks worsened his LUQ pain. Labs and CTAP were unrevealing. His clinical status was stable and he was discharged home on Omeprazole 20 mg daily, Sucralfate 1gm po qid, Dicyclomine 10mg  po tid PRN and Ondansetron.  He felt worse after taking sucralfate therefore he stopped taking it about 2 weeks ago and a few days later stop taking omeprazole.  He did not take dicyclomine as he read the potential side effects of this medication.  He took ondansetron to help him sleep and not for nausea.  He continues to have LUQ pain which worsens after eating foods such as potato chips and after drinking Gatorade.  Drinking water improves his LUQ pain.  He drinks 2-7 Coca-Cola's daily.  He had nausea and vomited up partially digested food until he emptied out on 1 day mid March 2024 without recurrence.  No coffee-ground or frank hematemesis. No heartburn. He denies ever having an EGD. He has chronic left neck pain and sometimes feels discomfort to the left neck throat area when he swallows. No dysphagia. He passes a normal brown formed stool most days. No rectal bleeding or black stools. Infrequent NSAID use. No alcohol use for the past year. He smokes marijuana daily.      Latest Ref Rng & Units 05/01/2022   12:38 PM 03/03/2021   12:05 PM  CBC  WBC 4.0 - 10.5 K/uL 13.4  11.1   Hemoglobin 13.0 - 17.0 g/dL 40.9  81.1   Hematocrit 39.0 -  52.0 % 44.9  47.3   Platelets 150 - 400 K/uL 263  288        Latest Ref Rng & Units 05/01/2022   12:38 PM 03/03/2021   12:05 PM  CMP  Glucose 70 - 99 mg/dL 914  782   BUN 6 - 20 mg/dL 16  <5   Creatinine 9.56 - 1.24 mg/dL 2.13  0.86   Sodium 578 - 145 mmol/L 135  141   Potassium 3.5 - 5.1 mmol/L 2.9  3.9   Chloride 98 - 111 mmol/L 100  103   CO2 22 - 32 mmol/L 25  26   Calcium 8.9 - 10.3 mg/dL 9.0  46.9   Total Protein 6.5 - 8.1 g/dL 7.7  8.3   Total Bilirubin 0.3 - 1.2 mg/dL 0.8  1.1   Alkaline Phos 38 - 126 U/L 61  65   AST 15 - 41 U/L 25  28   ALT 0 - 44 U/L 16  28   Lipase 42  CTAP with contrast 05/01/2022: FINDINGS: Lower chest: No acute abnormality.   Hepatobiliary: No focal liver abnormality is seen. No gallstones, gallbladder wall thickening, or biliary dilatation.   Pancreas: Unremarkable. No pancreatic ductal dilatation or surrounding inflammatory changes.   Spleen: Normal in size without focal abnormality.   Adrenals/Urinary Tract: Adrenal glands are unremarkable. Kidneys are normal, without renal calculi, focal lesion, or hydronephrosis. Bladder  is unremarkable.   Stomach/Bowel: Stomach is within normal limits. Appendix appears normal. No evidence of bowel wall thickening, distention, or inflammatory changes.   Vascular/Lymphatic: No significant vascular findings are present. No enlarged abdominal or pelvic lymph nodes.   Reproductive: Prostate is unremarkable.   Other: No abdominal wall hernia or abnormality. No abdominopelvic ascites.   Musculoskeletal: No fracture is seen.   IMPRESSION: No evidence of acute abnormality.   Past Medical History:  Diagnosis Date   GERD (gastroesophageal reflux disease)    Past Surgical History:  Procedure Laterality Date   WISDOM TOOTH EXTRACTION     Social History: No tobacco use. Marijuana once twice daily since age 42.  No alcohol for the past year.   Family History: Maternal grandmother had breast cancer.  Father with history of heart disease.  Maternal great uncle with colon cancer.   No Known Allergies    Outpatient Encounter Medications as of 06/11/2022  Medication Sig   omeprazole (PRILOSEC) 20 MG capsule Take 1 capsule (20 mg total) by mouth daily.   omeprazole (PRILOSEC) 20 MG capsule Take 1 capsule (20 mg total) by mouth daily. Take 30 minutes before breakfast.   ondansetron (ZOFRAN-ODT) 8 MG disintegrating tablet Take 8 mg by mouth every 8 (eight) hours as needed for nausea or vomiting.   [DISCONTINUED] acetaminophen (TYLENOL) 500 MG tablet Take 500 mg by mouth every 6 (six) hours as needed for mild pain. (Patient not taking: Reported on 06/11/2022)   [DISCONTINUED] doxycycline (VIBRA-TABS) 100 MG tablet Take 1 tablet (100 mg total) by mouth 2 (two) times daily. 1 po bid (Patient not taking: Reported on 05/01/2022)   [DISCONTINUED] ibuprofen (ADVIL) 200 MG tablet Take 400 mg by mouth every 6 (six) hours as needed for moderate pain. (Patient not taking: Reported on 06/11/2022)   [DISCONTINUED] predniSONE (DELTASONE) 20 MG tablet Take 2 tablets (40 mg total) by mouth daily with breakfast. For the next four days (Patient not taking: Reported on 05/01/2022)   [DISCONTINUED] sucralfate (CARAFATE) 1 g tablet Take 1 tablet (1 g total) by mouth 4 (four) times daily -  with meals and at bedtime for 15 days.   No facility-administered encounter medications on file as of 06/11/2022.    REVIEW OF SYSTEMS:  Gen: Denies fever, sweats or chills. No weight loss.  CV: Denies chest pain, palpitations or edema. Resp: Denies cough, shortness of breath of hemoptysis.  GI: See HPI. GU : Denies urinary burning, blood in urine, increased urinary frequency or incontinence. MS: Neck pain. Derm: Denies rash, itchiness, skin lesions or unhealing ulcers. Psych: Denies depression, anxiety, memory loss or confusion. Heme: Denies bruising, easy bleeding. Neuro:  Denies headaches, dizziness or paresthesias. Endo:  Denies  any problems with DM, thyroid or adrenal function.  PHYSICAL EXAM: BP 122/70   Pulse 88   Ht  (1.753 m)   Wt 116 lb (52.6 kg)   SpO2 97%   BMI 17.13 kg/m  General: in no acute distress. Head: Normocephalic and atraumatic. Eyes:  Sclerae non-icteric, conjunctive pink. Ears: Normal auditory acuity. Mouth: Dentition intact. No ulcers or lesions.  Neck: Supple, no lymphadenopathy or thyromegaly.  Lungs: Clear bilaterally to auscultation without wheezes, crackles or rhonchi. Heart: Regular rate and rhythm. No murmur, rub or gallop appreciated.  Abdomen: Soft, nontender, nondistended. No masses. No hepatosplenomegaly. Normoactive bowel sounds x 4 quadrants.  Rectal: Deferred.  Musculoskeletal: Symmetrical with no gross deformities. Skin: Warm and dry. No rash or lesions on visible extremities. Extremities: No edema.  Neurological: Alert oriented x 4, no focal deficits.  Psychological:  Alert and cooperative. Normal mood and affect.  ASSESSMENT AND PLAN:  27 year old male with LUQ pain x 5 to 6 weeks. Normal CBC, LFTs and lipase level. CTAP was unrevealing.  -I discussed checking H. pylori stool antigen versus EGD to rule out H. pylori gastritis/ulcers, patient prefers to proceed with an EGD. -EGD benefits and risks discussed including risk with sedation, risk of bleeding, perforation and infection  -GERD handout -Reduce soda intake -Start Omeprazole 20 mg 1 p.o. daily -Further recommendations to be determined after the above evaluation completed  Hypokalemia -BMP      CC:  No ref. provider found

## 2022-06-12 ENCOUNTER — Telehealth: Payer: Self-pay

## 2022-06-12 NOTE — Telephone Encounter (Signed)
Contacted pt's grandmother & pt's grand\mother verbalized understanding of normal lab result.

## 2022-06-27 ENCOUNTER — Ambulatory Visit (AMBULATORY_SURGERY_CENTER): Payer: Medicaid Other | Admitting: Gastroenterology

## 2022-06-27 ENCOUNTER — Ambulatory Visit: Payer: Self-pay

## 2022-06-27 ENCOUNTER — Encounter: Payer: Self-pay | Admitting: Gastroenterology

## 2022-06-27 VITALS — BP 110/60 | HR 77 | Temp 98.8°F | Resp 11 | Ht 69.0 in | Wt 116.0 lb

## 2022-06-27 DIAGNOSIS — R1012 Left upper quadrant pain: Secondary | ICD-10-CM | POA: Diagnosis not present

## 2022-06-27 DIAGNOSIS — R112 Nausea with vomiting, unspecified: Secondary | ICD-10-CM

## 2022-06-27 MED ORDER — SODIUM CHLORIDE 0.9 % IV SOLN
500.0000 mL | INTRAVENOUS | Status: DC
Start: 2022-06-27 — End: 2022-06-27

## 2022-06-27 NOTE — Progress Notes (Signed)
History and Physical Interval Note: Patient with intermittent LUQ pain, CT negative. Trial of PPI / carafate has not helped. Symptoms are postprandial, has had nausea and vomiting as well. EGD to further evaluate. He also smokes marijauna daily. Overall symptoms not has bad as they used to be but still bother him. He otherwise feels well today without complaints and wishes to proceed.    06/27/2022 2:51 PM  Brandon Glenn  has presented today for endoscopic procedure(s), with the diagnosis of  Encounter Diagnosis  Name Primary?   LUQ pain Yes  .  The various methods of evaluation and treatment have been discussed with the patient and/or family. After consideration of risks, benefits and other options for treatment, the patient has consented to  the endoscopic procedure(s).   The patient's history has been reviewed, patient examined, no change in status, stable for surgery.  I have reviewed the patient's chart and labs.  Questions were answered to the patient's satisfaction.    Harlin Rain, MD [facility]

## 2022-06-27 NOTE — Patient Instructions (Signed)
Marijuana use can cause issues.  The doctor suggests that you not smoke it for several weeks to see if it's related to your problem.  Resume all of your regular medications today as ordered.  YOU HAD AN ENDOSCOPIC PROCEDURE TODAY AT THE [facility]:   Refer to the procedure report that was given to you for any specific questions about what was found during the examination.  If the procedure report does not answer your questions, please call your gastroenterologist to clarify.  If you requested that your care partner not be given the details of your procedure findings, then the procedure report has been included in a sealed envelope for you to review at your convenience later.  YOU SHOULD EXPECT: Some feelings of bloating in the abdomen. Passage of more gas than usual.  Walking can help get rid of the air that was put into your GI tract during the procedure and reduce the bloating. If you had a lower endoscopy (such as a colonoscopy or flexible sigmoidoscopy) you may notice spotting of blood in your stool or on the toilet paper. If you underwent a bowel prep for your procedure, you may not have a normal bowel movement for a few days.  Please Note:  You might notice some irritation and congestion in your nose or some drainage.  This is from the oxygen used during your procedure.  There is no need for concern and it should clear up in a day or so.  SYMPTOMS TO REPORT IMMEDIATELY:  Following upper endoscopy (EGD)  Vomiting of blood or coffee ground material  New chest pain or pain under the shoulder blades  Painful or persistently difficult swallowing  New shortness of breath  Fever of 100F or higher  Black, tarry-looking stools  For urgent or emergent issues, a gastroenterologist can be reached at any hour by calling [phone]. Do not use MyChart messaging for urgent concerns.    DIET:  We do recommend a small meal at first, but then you may proceed to your regular diet.  Drink  plenty of fluids but you should avoid alcoholic beverages for 24 hours.  ACTIVITY:  You should plan to take it easy for the rest of today and you should NOT DRIVE or use heavy machinery until tomorrow (because of the sedation medicines used during the test).    FOLLOW UP: Our staff will call the number listed on your records the next business day following your procedure.  We will call around 7:15- 8:00 am to check on you and address any questions or concerns that you may have regarding the information given to you following your procedure. If we do not reach you, we will leave a message.     If any biopsies were taken you will be contacted by phone or by letter within the next 1-3 weeks.  Please call us at [phone] if you have not heard about the biopsies in 3 weeks.    SIGNATURES/CONFIDENTIALITY: You and/or your care partner have signed paperwork which will be entered into your electronic medical record.  These signatures attest to the fact that that the information above on your After Visit Summary has been reviewed and is understood.  Full responsibility of the confidentiality of this discharge information lies with you and/or your care-partner.

## 2022-06-27 NOTE — Progress Notes (Signed)
Called to room to assist during endoscopic procedure.  Patient ID and intended procedure confirmed with present staff. Received instructions for my participation in the procedure from the performing physician.  

## 2022-06-27 NOTE — Progress Notes (Signed)
A and O x3. Report to RN. Tolerated MAC anesthesia well.Teeth unchanged after procedure. 

## 2022-06-27 NOTE — Op Note (Signed)
[facility] Patient Name: Brandon Glenn Procedure Date: 06/27/2022 2:58 PM MRN: [id number] Endoscopist: Viviann Spare P. Adela Lank , MD, [id number] Age: 27 Referring MD:  Date of Birth: Jun 15, 1995 Gender: Male Account #: [id number] Procedure:                Upper GI endoscopy Indications:              Abdominal pain in the left upper quadrant, Nausea                            with vomiting - PPI, carafate does not help.                            Patient does use marijuana routinely. CT scan                            negative Medicines:                Monitored Anesthesia Care Procedure:                Pre-Anesthesia Assessment:                           - Prior to the procedure, a History and Physical                            was performed, and patient medications and                            allergies were reviewed. The patient's tolerance of                            previous anesthesia was also reviewed. The risks                            and benefits of the procedure and the sedation                            options and risks were discussed with the patient.                            All questions were answered, and informed consent                            was obtained. Prior Anticoagulants: The patient has                            taken no anticoagulant or antiplatelet agents. ASA                            Grade Assessment: II - A patient with mild systemic                            disease. After reviewing the risks and benefits,  the patient was deemed in satisfactory condition to                            undergo the procedure.                           After obtaining informed consent, the endoscope was                            passed under direct vision. Throughout the                            procedure, the patient's blood pressure, pulse, and                            oxygen saturations were monitored continuously.  The                            GIF [id number] #[id number] was introduced through the                            mouth, and advanced to the second part of duodenum.                            The upper GI endoscopy was accomplished without                            difficulty. The patient tolerated the procedure                            well. Scope In: Scope Out: Findings:                 The Z-line was regular.                           The exam of the esophagus was otherwise normal.                           The entire examined stomach was normal. Biopsies                            were taken with a cold forceps for Helicobacter                            pylori testing.                           The examined duodenum was normal. Biopsies for                            histology were taken with a cold forceps for                            evaluation of celiac disease. Complications:  No immediate complications. Estimated blood loss:                            Minimal. Estimated Blood Loss:     Estimated blood loss was minimal. Impression:               - Z-line regular.                           - Normal esophagus otherwise.                           - Normal stomach. Biopsied.                           - Normal examined duodenum. Biopsied.                           No overt cause of symptoms on this exam. Will await                            biopsy results. Marijuana use could be playing a                            role and recommend complete abstinence for several                            weeks to see if related. Recommendation:           - Patient has a contact number available for                            emergencies. The signs and symptoms of potential                            delayed complications were discussed with the                            patient. Return to normal activities tomorrow.                            Written discharge instructions were provided  to the                            patient.                           - Resume previous diet.                           - Continue present medications.                           - Await pathology results.                           - Abstain from marijuana for several weeks to see  if related to symptoms [name]. [name], MD 06/27/2022 3:14:24 PM This report has been signed electronically.

## 2022-06-28 ENCOUNTER — Telehealth: Payer: Self-pay

## 2022-06-28 NOTE — Telephone Encounter (Signed)
  Follow up Call-     06/27/2022    2:19 PM  Call back number  Post procedure Call Back phone  # [phone] grandmother Kennon Rounds  Permission to leave phone message Yes     Patient questions:  Do you have a fever, pain , or abdominal swelling? No. Pain Score  0 *  Have you tolerated food without any problems? Yes.    Have you been able to return to your normal activities? Yes.    Do you have any questions about your discharge instructions: Diet   No. Medications  No. Follow up visit  No.  Do you have questions or concerns about your Care? No.  Actions: * If pain score is 4 or above: No action needed, pain <4.

## 2022-07-02 ENCOUNTER — Encounter: Payer: Self-pay | Admitting: Gastroenterology

## 2022-09-17 ENCOUNTER — Ambulatory Visit: Payer: Medicaid Other | Admitting: Family Medicine

## 2022-09-19 ENCOUNTER — Encounter: Payer: Self-pay | Admitting: General Practice

## 2022-11-18 ENCOUNTER — Ambulatory Visit: Payer: Medicaid Other | Admitting: Nurse Practitioner

## 2022-11-18 NOTE — Progress Notes (Deleted)
New Patient Office Visit  Subjective    Patient ID: Brandon Glenn, male    DOB: [birth year]  Age: 27 y.o. MRN: [id number]  CC: No chief complaint on file.   HPI Brandon Glenn presents to establish care ***  Outpatient Encounter Medications as of 11/18/2022  Medication Sig   dicyclomine (BENTYL) 20 MG tablet Take by mouth.   omeprazole (PRILOSEC) 20 MG capsule Take 1 capsule (20 mg total) by mouth daily.   omeprazole (PRILOSEC) 20 MG capsule Take 1 capsule (20 mg total) by mouth daily. Take 30 minutes before breakfast.   ondansetron (ZOFRAN-ODT) 8 MG disintegrating tablet Take 8 mg by mouth every 8 (eight) hours as needed for nausea or vomiting.   No facility-administered encounter medications on file as of 11/18/2022.    Past Medical History:  Diagnosis Date   GERD (gastroesophageal reflux disease)     Past Surgical History:  Procedure Laterality Date   WISDOM TOOTH EXTRACTION      Family History  Problem Relation Age of Onset   Heart disease Father    Clotting disorder Father    Colon cancer Neg Hx    Stomach cancer Neg Hx    Esophageal cancer Neg Hx     Social History   Socioeconomic History   Marital status: Single    Spouse name: Not on file   Number of children: Not on file   Years of education: Not on file   Highest education level: Not on file  Occupational History   Not on file  Tobacco Use   Smoking status: Never    Passive exposure: Yes   Smokeless tobacco: Never  Vaping Use   Vaping status: Former  Substance and Sexual Activity   Alcohol use: No   Drug use: Yes    Types: Marijuana    Comment: Last smoked this morning at 10:30 am   Sexual activity: Yes  Other Topics Concern   Not on file  Social History Narrative   Not on file   Social Determinants of Health   Financial Resource Strain: Not on file  Food Insecurity: Not on file  Transportation Needs: Not on file  Physical Activity: Not on file  Stress: Not on file   Social Connections: Not on file  Intimate Partner Violence: Not on file    ROS Negative unless indicated in HPI   Objective    There were no vitals taken for this visit.  Physical Exam  Last CBC Lab Results  Component Value Date   WBC 13.4 (H) 05/01/2022   HGB 15.6 05/01/2022   HCT 44.9 05/01/2022   MCV 99.6 05/01/2022   MCH 34.6 (H) 05/01/2022   RDW 12.5 05/01/2022   PLT 263 05/01/2022   Last metabolic panel Lab Results  Component Value Date   GLUCOSE 92 06/11/2022   NA 138 06/11/2022   K 4.0 06/11/2022   CL 103 06/11/2022   CO2 28 06/11/2022   BUN 12 06/11/2022   CREATININE 0.89 06/11/2022   GFR 118.16 06/11/2022   CALCIUM 9.5 06/11/2022   PROT 7.7 05/01/2022   ALBUMIN 4.8 05/01/2022   BILITOT 0.8 05/01/2022   ALKPHOS 61 05/01/2022   AST 25 05/01/2022   ALT 16 05/01/2022   ANIONGAP 10 05/01/2022      Assessment & Plan:  Routine medical exam   Continue healthy lifestyle choices, including diet (rich in fruits, vegetables, and lean proteins, and low in salt and simple carbohydrates) and exercise (at least  30 minutes of moderate physical activity daily).     The above assessment and management plan was discussed with the patient. The patient verbalized understanding of and has agreed to the management plan. Patient is aware to call the clinic if they develop any new symptoms or if symptoms persist or worsen. Patient is aware when to return to the clinic for a follow-up visit. Patient educated on when it is appropriate to go to the emergency department.  No follow-ups on file.   Arrie Aran Santa Lighter, DNP [facility] [address] [place], [place] [address] [phone]

## 2022-12-18 ENCOUNTER — Ambulatory Visit: Payer: Medicaid Other | Admitting: Nurse Practitioner

## 2022-12-18 ENCOUNTER — Encounter: Payer: Self-pay | Admitting: Nurse Practitioner

## 2022-12-18 VITALS — BP 136/75 | HR 117 | Temp 98.2°F | Ht 69.0 in | Wt 119.8 lb

## 2022-12-18 DIAGNOSIS — Z0001 Encounter for general adult medical examination with abnormal findings: Secondary | ICD-10-CM | POA: Diagnosis not present

## 2022-12-18 DIAGNOSIS — K219 Gastro-esophageal reflux disease without esophagitis: Secondary | ICD-10-CM | POA: Insufficient documentation

## 2022-12-18 DIAGNOSIS — R636 Underweight: Secondary | ICD-10-CM | POA: Insufficient documentation

## 2022-12-18 DIAGNOSIS — Z136 Encounter for screening for cardiovascular disorders: Secondary | ICD-10-CM | POA: Diagnosis not present

## 2022-12-18 DIAGNOSIS — R0981 Nasal congestion: Secondary | ICD-10-CM | POA: Insufficient documentation

## 2022-12-18 DIAGNOSIS — R1012 Left upper quadrant pain: Secondary | ICD-10-CM

## 2022-12-18 DIAGNOSIS — Z0000 Encounter for general adult medical examination without abnormal findings: Secondary | ICD-10-CM | POA: Insufficient documentation

## 2022-12-18 DIAGNOSIS — Z681 Body mass index (BMI) 19 or less, adult: Secondary | ICD-10-CM | POA: Insufficient documentation

## 2022-12-18 DIAGNOSIS — M542 Cervicalgia: Secondary | ICD-10-CM | POA: Diagnosis not present

## 2022-12-18 MED ORDER — FLUTICASONE PROPIONATE 50 MCG/ACT NA SUSP: 2.0000 | Freq: Every day | NASAL | 3 refills | Status: AC

## 2022-12-18 MED ORDER — OMEPRAZOLE 20 MG PO CPDR: 20.0000 mg | DELAYED_RELEASE_CAPSULE | Freq: Every day | ORAL | 0 refills | Status: AC

## 2022-12-18 NOTE — Progress Notes (Signed)
New Patient Office Visit  Subjective    Patient ID: Brandon Glenn, male    DOB: [birth year]  Age: 27 y.o. MRN: [id number]  CC:  Chief Complaint  Patient presents with   Establish Care   Neck Pain    Had an accident and needs referral for neck pain. Also thinks neck pain may be related to a throat problem    HPI Brandon Glenn is 27 yrs old male presents with is mother to establish care and  c/o of neck pain, epigastric pain. He is very poor historian. He is all over the place, and unable to seat still and appears intoxicated.  Endorses smoking THC 2-3 g daily "it is about $20-30 daily" Denies any history of mental illness or family history of mental illness, he is very rude and refuses to answer questions about mental state.     12/18/2022   11:03 AM  PHQ9 SCORE ONLY  PHQ-9 Total Score 0     Neck Pain: Paitent complains of neck pain. Event that precipitate these symptoms: injured while was a skateboard accident when he was 27 yrs old and has been having neck pain . Onset of symptoms several years ago, unchanged since that time. Current symptoms are pain in cervical area (aching in character; 6/10 in severity). Patient denies paresthesias, stiffness, and weakness in . Patient has had no prior neck problems.  Previous treatments include: none.  He had CT done 02/13/2023"AP and lateral views. The AP view demonstrates possible subcutaneous, gas about the left side of the neck. The lateral view images through the bottom of C7. Normal prevertebral soft tissues. Straightening and mild reversal of expected cervical lordosis. Maintenance of vertebral body height. " He is requesting a referral to neurology  Stomach pain/LUQ pain Reports pain after eating was 06/2022 and had CT  which was negative. He was started on Carafate and PPI and reports minimal relief. Tis has been an ongoing problem. GERD: Paitent complains of heartburn. He was sen at the This has been associated with  abdominal bloating and belching.  He denies no other symptoms. Symptoms have been present for 6 months. He denies dysphagia.  He has not lost weight. He denies melena, hematochezia, hematemesis, and coffee ground emesis. Medical therapy in the past has included  was on Omeprazole stop taking it .    Outpatient Encounter Medications as of 12/18/2022  Medication Sig   fluticasone (FLONASE) 50 MCG/ACT nasal spray Place 2 sprays into both nostrils daily.   omeprazole (PRILOSEC) 20 MG capsule Take 1 capsule (20 mg total) by mouth daily.   [DISCONTINUED] dicyclomine (BENTYL) 20 MG tablet Take by mouth. (Patient not taking: Reported on 12/18/2022)   [DISCONTINUED] omeprazole (PRILOSEC) 20 MG capsule Take 1 capsule (20 mg total) by mouth daily.   [DISCONTINUED] omeprazole (PRILOSEC) 20 MG capsule Take 1 capsule (20 mg total) by mouth daily. Take 30 minutes before breakfast. (Patient not taking: Reported on 12/18/2022)   [DISCONTINUED] ondansetron (ZOFRAN-ODT) 8 MG disintegrating tablet Take 8 mg by mouth every 8 (eight) hours as needed for nausea or vomiting. (Patient not taking: Reported on 12/18/2022)   No facility-administered encounter medications on file as of 12/18/2022.    Past Medical History:  Diagnosis Date   GERD (gastroesophageal reflux disease)     Past Surgical History:  Procedure Laterality Date   WISDOM TOOTH EXTRACTION      Family History  Problem Relation Age of Onset   Heart disease Father  Clotting disorder Father    Colon cancer Neg Hx    Stomach cancer Neg Hx    Esophageal cancer Neg Hx     Social History   Socioeconomic History   Marital status: Single    Spouse name: Not on file   Number of children: Not on file   Years of education: Not on file   Highest education level: Not on file  Occupational History   Not on file  Tobacco Use   Smoking status: Never    Passive exposure: Yes   Smokeless tobacco: Never  Vaping Use   Vaping status: Former   Substance and Sexual Activity   Alcohol use: No   Drug use: Yes    Types: Marijuana    Comment: Last smoked this morning at 10:30 am   Sexual activity: Yes  Other Topics Concern   Not on file  Social History Narrative   Not on file   Social Determinants of Health   Financial Resource Strain: Not on file  Food Insecurity: Not on file  Transportation Needs: Not on file  Physical Activity: Not on file  Stress: Not on file  Social Connections: Not on file  Intimate Partner Violence: Not on file    Review of Systems  Constitutional:  Negative for chills and fever.  HENT:  Negative for ear pain and sore throat.   Respiratory:  Negative for cough, shortness of breath and stridor.   Gastrointestinal:  Positive for heartburn. Negative for nausea and vomiting.  Genitourinary:  Negative for hematuria and urgency.  Musculoskeletal:  Positive for neck pain. Negative for falls and myalgias.  Neurological:  Negative for dizziness and headaches.  Endo/Heme/Allergies:  Negative for environmental allergies and polydipsia. Does not bruise/bleed easily.  Psychiatric/Behavioral:  Positive for substance abuse. The patient does not have insomnia.        THC   Negative unless indicated in HPI   Objective    BP 136/75   Pulse (!) 117   Temp 98.2 F (36.8 C) (Temporal)   Ht 5\' 9"  (1.753 m)   Wt 119 lb 12.8 oz (54.3 kg)   SpO2 99%   BMI 17.69 kg/m   Physical Exam Vitals and nursing note reviewed.  Constitutional:      Appearance: Normal appearance. He is underweight.  HENT:     Head: Normocephalic and atraumatic.  Eyes:     Extraocular Movements: Extraocular movements intact.     Conjunctiva/sclera: Conjunctivae normal.     Pupils: Pupils are equal, round, and reactive to light.  Cardiovascular:     Rate and Rhythm: Normal rate and regular rhythm.  Pulmonary:     Effort: Pulmonary effort is normal.     Breath sounds: Normal breath sounds.  Musculoskeletal:        General:  Normal range of motion.     Cervical back: Normal range of motion and neck supple.     Right lower leg: No edema.     Left lower leg: No edema.  Skin:    General: Skin is warm and dry.     Findings: No rash.  Neurological:     Mental Status: He is alert and oriented to person, place, and time. Mental status is at baseline.  Psychiatric:        Mood and Affect: Mood normal.        Behavior: Behavior normal.        Thought Content: Thought content normal.  Judgment: Judgment normal.     Last CBC Lab Results  Component Value Date   WBC 13.4 (H) 05/01/2022   HGB 15.6 05/01/2022   HCT 44.9 05/01/2022   MCV 99.6 05/01/2022   MCH 34.6 (H) 05/01/2022   RDW 12.5 05/01/2022   PLT 263 05/01/2022   Last metabolic panel Lab Results  Component Value Date   GLUCOSE 92 06/11/2022   NA 138 06/11/2022   K 4.0 06/11/2022   CL 103 06/11/2022   CO2 28 06/11/2022   BUN 12 06/11/2022   CREATININE 0.89 06/11/2022   GFR 118.16 06/11/2022   CALCIUM 9.5 06/11/2022   PROT 7.7 05/01/2022   ALBUMIN 4.8 05/01/2022   BILITOT 0.8 05/01/2022   ALKPHOS 61 05/01/2022   AST 25 05/01/2022   ALT 16 05/01/2022   ANIONGAP 10 05/01/2022      Assessment & Plan:  Gastroesophageal reflux disease without esophagitis -     CMP14+EGFR -     Omeprazole; Take 1 capsule (20 mg total) by mouth daily.  Dispense: 90 capsule; Refill: 0 -     Ambulatory referral to Gastroenterology  LUQ pain -     Ambulatory referral to Gastroenterology  Nasal congestion -     Fluticasone Propionate; Place 2 sprays into both nostrils daily.  Dispense: 16 g; Refill: 3  Encounter for general adult medical examination with abnormal findings -     CBC with Differential/Platelet -     CMP14+EGFR -     Lipid panel -     Thyroid Panel With TSH  Underweight (BMI < 18.5)  Neck pain on left side -     Ambulatory referral to Orthopedics  LUQ pain refer to GI Neck pain: refer to Ortho GERD: restart  Omeprazole Underweight needs to increase caloric intake Labs: Cbc, CMP, Lipid, TSH Encourage healthy lifestyle choices, including diet (rich in fruits, vegetables, and lean proteins, and low in salt and simple carbohydrates) and exercise (at least 30 minutes of moderate physical activity daily).     The above assessment and management plan was discussed with the patient. The patient verbalized understanding of and has agreed to the management plan. Patient is aware to call the clinic if they develop any new symptoms or if symptoms persist or worsen. Patient is aware when to return to the clinic for a follow-up visit. Patient educated on when it is appropriate to go to the emergency department.  Return in about 3 months (around 03/20/2023).   Arrie Aran Santa Lighter, DNP [facility] [address] [place], [place] [address] [phone]

## 2022-12-19 ENCOUNTER — Encounter: Payer: Self-pay | Admitting: Nurse Practitioner

## 2022-12-19 LAB — CMP14+EGFR
ALT: 15 [IU]/L (ref 0–44)
AST: 29 [IU]/L (ref 0–40)
Albumin: 4.9 g/dL (ref 4.3–5.2)
Alkaline Phosphatase: 92 [IU]/L (ref 44–121)
BUN/Creatinine Ratio: 6 — ABNORMAL LOW (ref 9–20)
BUN: 6 mg/dL (ref 6–20)
Bilirubin Total: 0.5 mg/dL (ref 0.0–1.2)
CO2: 21 mmol/L (ref 20–29)
Calcium: 9.8 mg/dL (ref 8.7–10.2)
Chloride: 100 mmol/L (ref 96–106)
Creatinine, Ser: 0.97 mg/dL (ref 0.76–1.27)
Globulin, Total: 2.5 g/dL (ref 1.5–4.5)
Glucose: 143 mg/dL — ABNORMAL HIGH (ref 70–99)
Potassium: 4.8 mmol/L (ref 3.5–5.2)
Sodium: 141 mmol/L (ref 134–144)
Total Protein: 7.4 g/dL (ref 6.0–8.5)
eGFR: 110 mL/min/{1.73_m2} (ref >59)

## 2022-12-19 LAB — LIPID PANEL
Chol/HDL Ratio: 3 ratio (ref 0.0–5.0)
Cholesterol, Total: 161 mg/dL (ref 100–199)
HDL: 54 mg/dL (ref >39)
LDL Chol Calc (NIH): 95 mg/dL (ref 0–99)
Triglycerides: 59 mg/dL (ref 0–149)
VLDL Cholesterol Cal: 12 mg/dL (ref 5–40)

## 2022-12-19 LAB — CBC WITH DIFFERENTIAL/PLATELET
Basophils Absolute: 0 10*3/uL (ref 0.0–0.2)
Basos: 1 %
EOS (ABSOLUTE): 0.1 10*3/uL (ref 0.0–0.4)
Eos: 1 %
Hematocrit: 48.3 % (ref 37.5–51.0)
Hemoglobin: 16.2 g/dL (ref 13.0–17.7)
Immature Grans (Abs): 0 10*3/uL (ref 0.0–0.1)
Immature Granulocytes: 0 %
Lymphocytes Absolute: 3 10*3/uL (ref 0.7–3.1)
Lymphs: 42 %
MCH: 34 pg — ABNORMAL HIGH (ref 26.6–33.0)
MCHC: 33.5 g/dL (ref 31.5–35.7)
MCV: 102 fL — ABNORMAL HIGH (ref 79–97)
Monocytes Absolute: 0.4 10*3/uL (ref 0.1–0.9)
Monocytes: 5 %
Neutrophils Absolute: 3.7 10*3/uL (ref 1.4–7.0)
Neutrophils: 51 %
Platelets: 246 10*3/uL (ref 150–450)
RBC: 4.76 x10E6/uL (ref 4.14–5.80)
RDW: 11.1 % — ABNORMAL LOW (ref 11.6–15.4)
WBC: 7.1 10*3/uL (ref 3.4–10.8)

## 2022-12-19 LAB — THYROID PANEL WITH TSH
Free Thyroxine Index: 2.1 (ref 1.2–4.9)
T3 Uptake Ratio: 22 % — ABNORMAL LOW (ref 24–39)
T4, Total: 9.5 ug/dL (ref 4.5–12.0)
TSH: 2.41 u[IU]/mL (ref 0.450–4.500)

## 2023-02-04 ENCOUNTER — Telehealth: Payer: Self-pay | Admitting: Nurse Practitioner

## 2023-02-04 NOTE — Telephone Encounter (Signed)
Patient grandmother called and stated her grandson was received multiple calls from our office. In his chart it is not noted that anyone has called. Patient grandmother is requesting a call back. Please advise.

## 2023-02-04 NOTE — Telephone Encounter (Signed)
Unable to reach pt grandmother or leave voice mail to due to pt voice mailbox being full at this time.

## 2023-02-05 NOTE — Telephone Encounter (Signed)
Spoke with pt grandmother who stated that someone from our office has called her grandson from our office. Chart reviewed and noted no documentation: Pt grandmother made aware. Pt grandmother verbalized understanding with all questions answered.

## 2023-03-16 NOTE — Progress Notes (Deleted)
Established Patient Office Visit  Subjective  Patient ID: Brandon Glenn, male    DOB: 07/13/1995  Age: 28 y.o. MRN: [id number]  No chief complaint on file.   HPI  Patient Active Problem List   Diagnosis Date Noted   Gastroesophageal reflux disease without esophagitis 12/18/2022   Routine medical exam 12/18/2022   Encounter for general adult medical examination with abnormal findings 12/18/2022   Nasal congestion 12/18/2022   Underweight (BMI < 18.5) 12/18/2022   Neck pain on left side 12/18/2022   LUQ pain 06/11/2022   Past Medical History:  Diagnosis Date   GERD (gastroesophageal reflux disease)    Past Surgical History:  Procedure Laterality Date   WISDOM TOOTH EXTRACTION     Social History   Tobacco Use   Smoking status: Never    Passive exposure: Yes   Smokeless tobacco: Never  Vaping Use   Vaping status: Former  Substance Use Topics   Alcohol use: No   Drug use: Yes    Types: Marijuana    Comment: Last smoked this morning at 10:30 am   Social History   Socioeconomic History   Marital status: Single    Spouse name: Not on file   Number of children: Not on file   Years of education: Not on file   Highest education level: Not on file  Occupational History   Not on file  Tobacco Use   Smoking status: Never    Passive exposure: Yes   Smokeless tobacco: Never  Vaping Use   Vaping status: Former  Substance and Sexual Activity   Alcohol use: No   Drug use: Yes    Types: Marijuana    Comment: Last smoked this morning at 10:30 am   Sexual activity: Yes  Other Topics Concern   Not on file  Social History Narrative   Not on file   Social Drivers of Health   Financial Resource Strain: Not on file  Food Insecurity: Not on file  Transportation Needs: Not on file  Physical Activity: Not on file  Stress: Not on file  Social Connections: Not on file  Intimate Partner Violence: Not on file   Family Status  Relation Name Status   Mother  Alive    Father  Alive   Sister  Alive   Neg Hx  (Not Specified)  No partnership data on file   Family History  Problem Relation Age of Onset   Heart disease Father    Clotting disorder Father    Colon cancer Neg Hx    Stomach cancer Neg Hx    Esophageal cancer Neg Hx    No Known Allergies    ROS Negative unless indicated in HPI   Objective:     There were no vitals taken for this visit. BP Readings from Last 3 Encounters:  12/18/22 136/75  06/27/22 110/60  06/11/22 122/70   Wt Readings from Last 3 Encounters:  12/18/22 119 lb 12.8 oz (54.3 kg)  06/27/22 116 lb (52.6 kg)  06/11/22 116 lb (52.6 kg)      Physical Exam   No results found for any visits on 03/20/23.  Last CBC Lab Results  Component Value Date   WBC 7.1 12/18/2022   HGB 16.2 12/18/2022   HCT 48.3 12/18/2022   MCV 102 (H) 12/18/2022   MCH 34.0 (H) 12/18/2022   RDW 11.1 (L) 12/18/2022   PLT 246 12/18/2022   Last metabolic panel Lab Results  Component Value Date  GLUCOSE 143 (H) 12/18/2022   NA 141 12/18/2022   K 4.8 12/18/2022   CL 100 12/18/2022   CO2 21 12/18/2022   BUN 6 12/18/2022   CREATININE 0.97 12/18/2022   EGFR 110 12/18/2022   CALCIUM 9.8 12/18/2022   PROT 7.4 12/18/2022   ALBUMIN 4.9 12/18/2022   LABGLOB 2.5 12/18/2022   BILITOT 0.5 12/18/2022   ALKPHOS 92 12/18/2022   AST 29 12/18/2022   ALT 15 12/18/2022   ANIONGAP 10 05/01/2022   Last lipids Lab Results  Component Value Date   CHOL 161 12/18/2022   HDL 54 12/18/2022   LDLCALC 95 12/18/2022   TRIG 59 12/18/2022   CHOLHDL 3.0 12/18/2022   Last hemoglobin A1c No results found for: "HGBA1C" Last thyroid functions Lab Results  Component Value Date   TSH 2.410 12/18/2022   T4TOTAL 9.5 12/18/2022        Assessment & Plan:  There are no diagnoses linked to this encounter. Continue healthy lifestyle choices, including diet (rich in fruits, vegetables, and lean proteins, and low in salt and simple carbohydrates)  and exercise (at least 30 minutes of moderate physical activity daily).     The above assessment and management plan was discussed with the patient. The patient verbalized understanding of and has agreed to the management plan. Patient is aware to call the clinic if they develop any new symptoms or if symptoms persist or worsen. Patient is aware when to return to the clinic for a follow-up visit. Patient educated on when it is appropriate to go to the emergency department.  No follow-ups on file.   Arrie Aran Santa Lighter, [facility] [address] [place], [place] [address] [phone]    Note: This document was prepared by [employer] voice dictation technology and any errors that results from this process are unintentional. Dragon voice dictation technology and any errors that results from this process are unintentional.

## 2023-03-20 ENCOUNTER — Ambulatory Visit: Payer: Medicaid Other | Admitting: Nurse Practitioner

## 2023-03-25 ENCOUNTER — Ambulatory Visit: Payer: Medicaid Other | Admitting: Nurse Practitioner

## 2023-03-25 ENCOUNTER — Encounter: Payer: Self-pay | Admitting: Nurse Practitioner

## 2023-03-25 NOTE — Progress Notes (Deleted)
Established Patient Office Visit  Subjective  Patient ID: Brandon Glenn, male    DOB: 25-Aug-1995  Age: 28 y.o. MRN: [id number]  No chief complaint on file.   HPI  Patient Active Problem List   Diagnosis Date Noted  . Gastroesophageal reflux disease without esophagitis 12/18/2022  . Routine medical exam 12/18/2022  . Encounter for general adult medical examination with abnormal findings 12/18/2022  . Nasal congestion 12/18/2022  . Underweight (BMI < 18.5) 12/18/2022  . Neck pain on left side 12/18/2022  . LUQ pain 06/11/2022   Past Medical History:  Diagnosis Date  . GERD (gastroesophageal reflux disease)    Past Surgical History:  Procedure Laterality Date  . WISDOM TOOTH EXTRACTION     Social History   Tobacco Use  . Smoking status: Never    Passive exposure: Yes  . Smokeless tobacco: Never  Vaping Use  . Vaping status: Former  Substance Use Topics  . Alcohol use: No  . Drug use: Yes    Types: Marijuana    Comment: Last smoked this morning at 10:30 am   Social History   Socioeconomic History  . Marital status: Single    Spouse name: Not on file  . Number of children: Not on file  . Years of education: Not on file  . Highest education level: Not on file  Occupational History  . Not on file  Tobacco Use  . Smoking status: Never    Passive exposure: Yes  . Smokeless tobacco: Never  Vaping Use  . Vaping status: Former  Substance and Sexual Activity  . Alcohol use: No  . Drug use: Yes    Types: Marijuana    Comment: Last smoked this morning at 10:30 am  . Sexual activity: Yes  Other Topics Concern  . Not on file  Social History Narrative  . Not on file   Social Drivers of Health   Financial Resource Strain: Not on file  Food Insecurity: Not on file  Transportation Needs: Not on file  Physical Activity: Not on file  Stress: Not on file  Social Connections: Not on file  Intimate Partner Violence: Not on file   Family Status  Relation  Name Status  . Mother  Alive  . Father  Alive  . Sister  Alive  . Neg Hx  (Not Specified)  No partnership data on file   Family History  Problem Relation Age of Onset  . Heart disease Father   . Clotting disorder Father   . Colon cancer Neg Hx   . Stomach cancer Neg Hx   . Esophageal cancer Neg Hx    No Known Allergies    ROS Negative unless indicated in HPI   Objective:     There were no vitals taken for this visit. BP Readings from Last 3 Encounters:  12/18/22 136/75  06/27/22 110/60  06/11/22 122/70   Wt Readings from Last 3 Encounters:  12/18/22 119 lb 12.8 oz (54.3 kg)  06/27/22 116 lb (52.6 kg)  06/11/22 116 lb (52.6 kg)      Physical Exam   No results found for any visits on 03/25/23.  Last CBC Lab Results  Component Value Date   WBC 7.1 12/18/2022   HGB 16.2 12/18/2022   HCT 48.3 12/18/2022   MCV 102 (H) 12/18/2022   MCH 34.0 (H) 12/18/2022   RDW 11.1 (L) 12/18/2022   PLT 246 12/18/2022   Last metabolic panel Lab Results  Component Value Date  GLUCOSE 143 (H) 12/18/2022   NA 141 12/18/2022   K 4.8 12/18/2022   CL 100 12/18/2022   CO2 21 12/18/2022   BUN 6 12/18/2022   CREATININE 0.97 12/18/2022   EGFR 110 12/18/2022   CALCIUM 9.8 12/18/2022   PROT 7.4 12/18/2022   ALBUMIN 4.9 12/18/2022   LABGLOB 2.5 12/18/2022   BILITOT 0.5 12/18/2022   ALKPHOS 92 12/18/2022   AST 29 12/18/2022   ALT 15 12/18/2022   ANIONGAP 10 05/01/2022   Last lipids Lab Results  Component Value Date   CHOL 161 12/18/2022   HDL 54 12/18/2022   LDLCALC 95 12/18/2022   TRIG 59 12/18/2022   CHOLHDL 3.0 12/18/2022   Last hemoglobin A1c No results found for: "HGBA1C" Last thyroid functions Lab Results  Component Value Date   TSH 2.410 12/18/2022   T4TOTAL 9.5 12/18/2022        Assessment & Plan:  There are no diagnoses linked to this encounter. Continue healthy lifestyle choices, including diet (rich in fruits, vegetables, and lean proteins, and  low in salt and simple carbohydrates) and exercise (at least 30 minutes of moderate physical activity daily).     The above assessment and management plan was discussed with the patient. The patient verbalized understanding of and has agreed to the management plan. Patient is aware to call the clinic if they develop any new symptoms or if symptoms persist or worsen. Patient is aware when to return to the clinic for a follow-up visit. Patient educated on when it is appropriate to go to the emergency department.  No follow-ups on file.   Arrie Aran Santa Lighter, [facility] [address] [place], [place] [address] [phone]    Note: This document was prepared by [employer] voice dictation technology and any errors that results from this process are unintentional. Dragon voice dictation technology and any errors that results from this process are unintentional.

## 2023-11-04 IMAGING — CR DG CHEST 2V
2 series · 2 of 2 positions shown · non-contrast
Comparison: 02/12/2013.

CLINICAL DATA: Short of breath for several days.

EXAM:
CHEST - 2 VIEW

[chest lat]
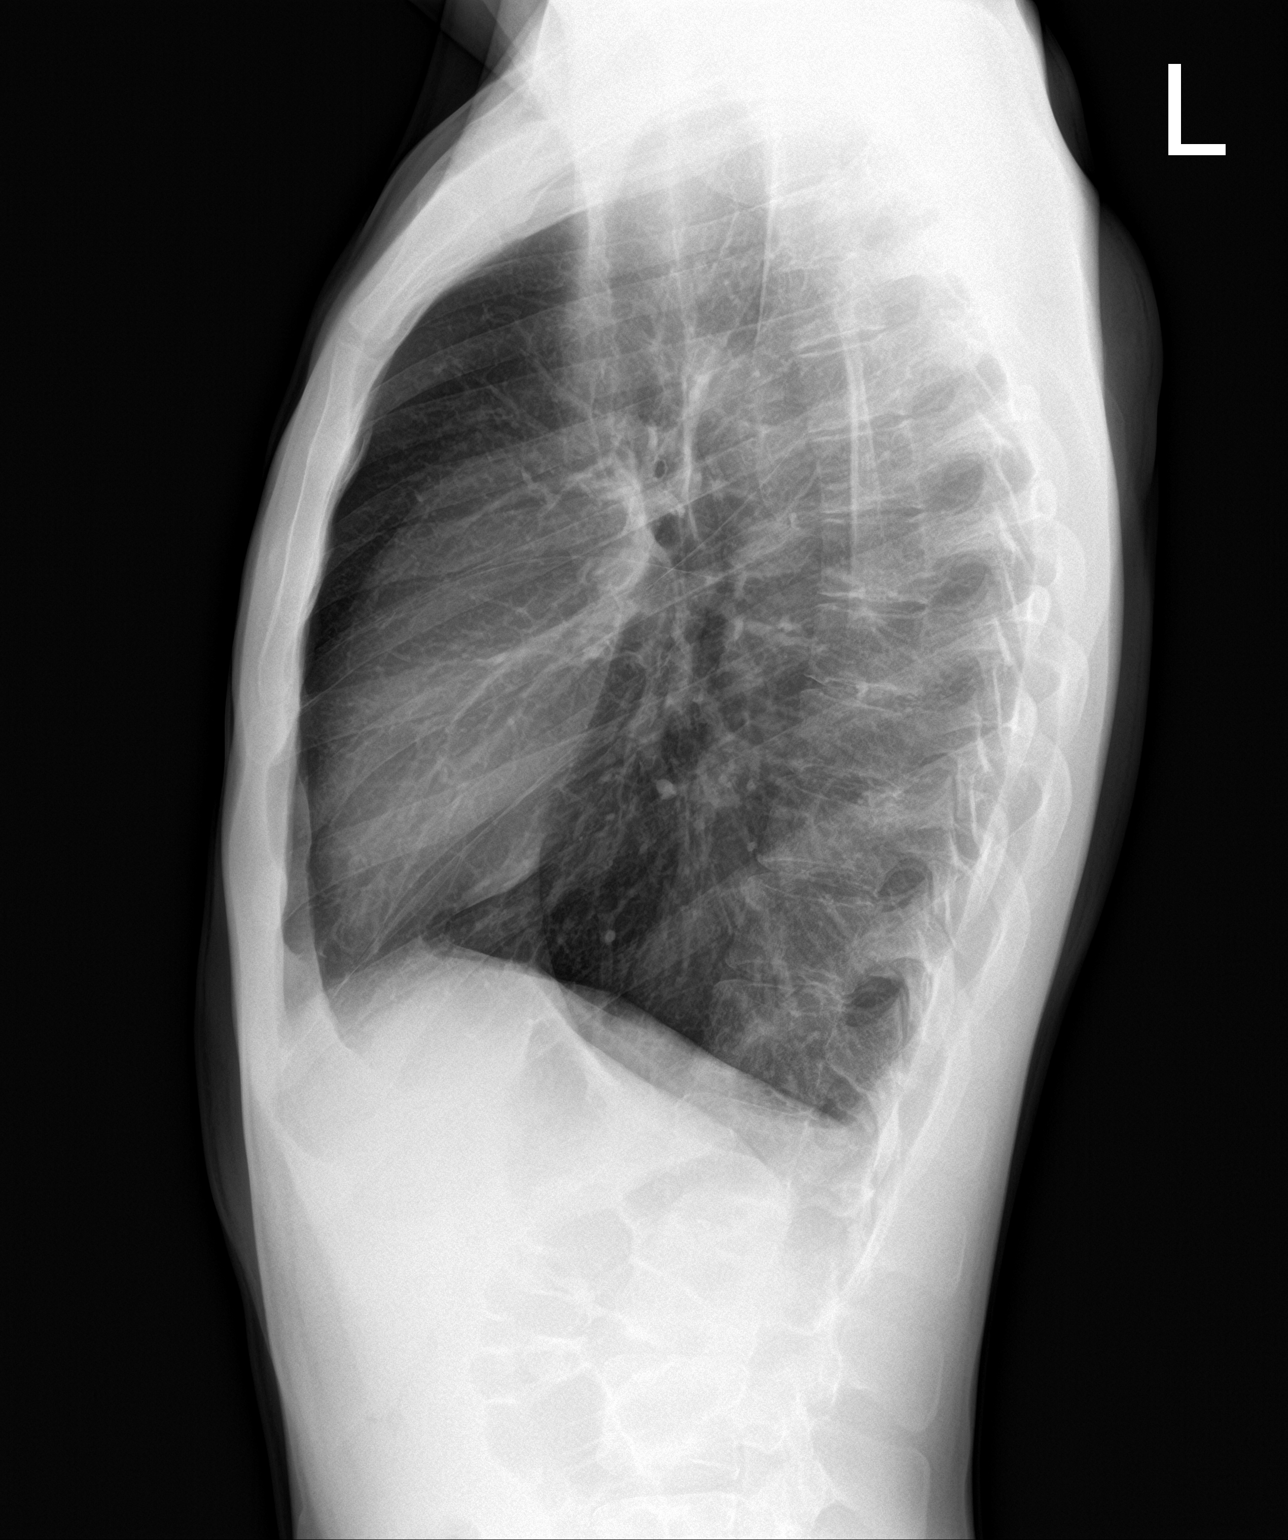

[chest pa]
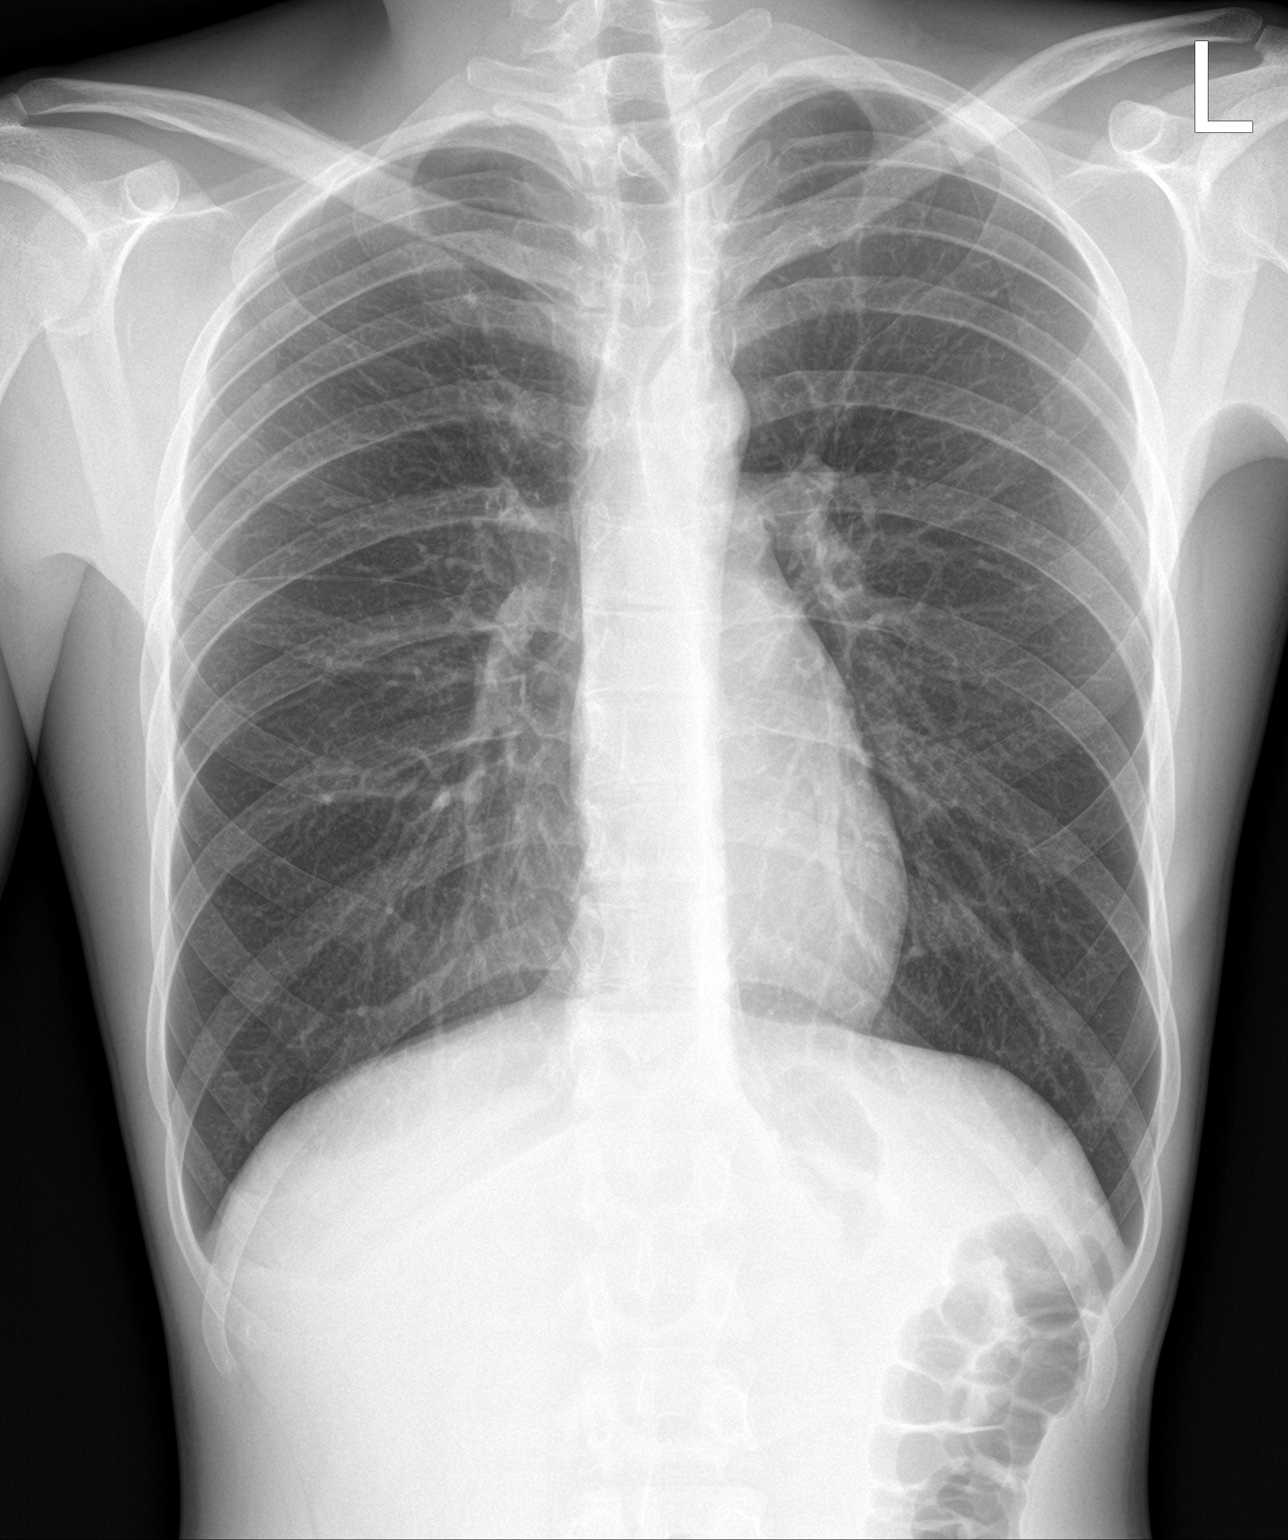

[2 of 2 positions shown; findings below may reference images not displayed]

FINDINGS: Normal heart, mediastinum and hila.

Clear lungs.  No pleural effusion or pneumothorax.

Skeletal structures are unremarkable.
IMPRESSION: No active cardiopulmonary disease.
